# Patient Record
Sex: Female | Born: 1980 | Race: White | Hispanic: No | Smoking: Current every day smoker
Health system: Southern US, Community
[De-identification: ages and names within clinical notes are randomized; demographics above are authoritative.]

## PROBLEM LIST (undated history)

## (undated) DIAGNOSIS — J302 Other seasonal allergic rhinitis: Secondary | ICD-10-CM

## (undated) HISTORY — PX: OTHER SURGICAL HISTORY: SHX169

## (undated) HISTORY — PX: LAPAROSCOPIC UNILATERAL SALPINGO OOPHERECTOMY: SHX5935

## (undated) HISTORY — PX: WISDOM TOOTH EXTRACTION: SHX21

---

## 2001-11-02 ENCOUNTER — Emergency Department (HOSPITAL_COMMUNITY): Admission: EM | Admit: 2001-11-02 | Discharge: 2001-11-03 | Payer: Self-pay | Admitting: Emergency Medicine

## 2002-11-08 HISTORY — PX: LAPAROTOMY: SHX154

## 2003-04-16 ENCOUNTER — Observation Stay (HOSPITAL_COMMUNITY): Admission: RE | Admit: 2003-04-16 | Discharge: 2003-04-17 | Payer: Self-pay | Admitting: Obstetrics and Gynecology

## 2003-11-09 HISTORY — PX: DIAGNOSTIC LAPAROSCOPY: SUR761

## 2004-07-09 ENCOUNTER — Observation Stay (HOSPITAL_COMMUNITY): Admission: RE | Admit: 2004-07-09 | Discharge: 2004-07-10 | Payer: Self-pay | Admitting: Obstetrics and Gynecology

## 2004-11-03 ENCOUNTER — Ambulatory Visit (HOSPITAL_COMMUNITY): Admission: RE | Admit: 2004-11-03 | Discharge: 2004-11-03 | Payer: Self-pay | Admitting: Obstetrics and Gynecology

## 2005-03-03 ENCOUNTER — Other Ambulatory Visit: Admission: RE | Admit: 2005-03-03 | Discharge: 2005-03-03 | Payer: Self-pay | Admitting: Obstetrics and Gynecology

## 2005-09-09 ENCOUNTER — Inpatient Hospital Stay (HOSPITAL_COMMUNITY): Admission: AD | Admit: 2005-09-09 | Discharge: 2005-09-11 | Payer: Self-pay | Admitting: Obstetrics and Gynecology

## 2006-04-29 IMAGING — RF DG HYSTEROGRAM
6 series · 6 of 6 positions shown · IV contrast (omnipaque)
Comparison: none

CLINICAL DATA: 23-year-old with history of left salpingectomy and oophorectomy.  Endometriosis.  Patient had ectopic pregnancy and what is described as a salpingostomy in Friday July, 2004.
 HYSTEROGRAM:  
 Following discussion of the procedure, sterile technique was used to cannulate the cervix.  20 cc of Omnipaque 300 was utilized for the exam.  Reflux of contrast into the uterus during fluoroscopic guidance was performed.
 The uterine contour is normal.  There is filling of proximal most portion of the left fallopian tube, consistent with the history of prior left salpingectomy.  Contrast does fill the right tube later in the exam.  There is a collection of contrast probably within the wall of the tube which appears somewhat amorphous and may be related to prior salpingostomy.  However, the distal portion of the right tube does fill and there is free peritoneal spill on the right.

[Series 1: run · 1 of 1 slices shown (1 of 6)]
[im 1/1]
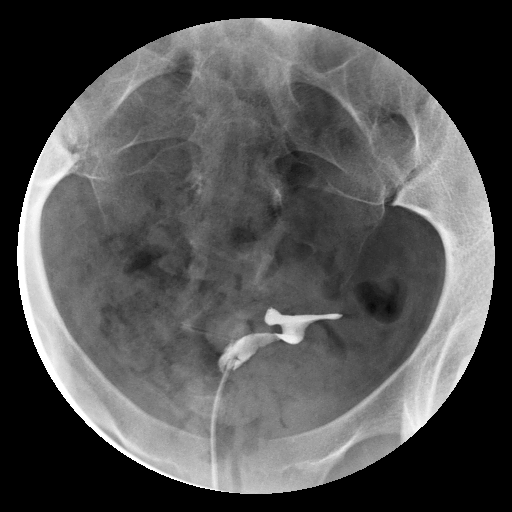

[Series 2: run · 1 of 1 slices shown (2 of 6)]
[im 1/1]
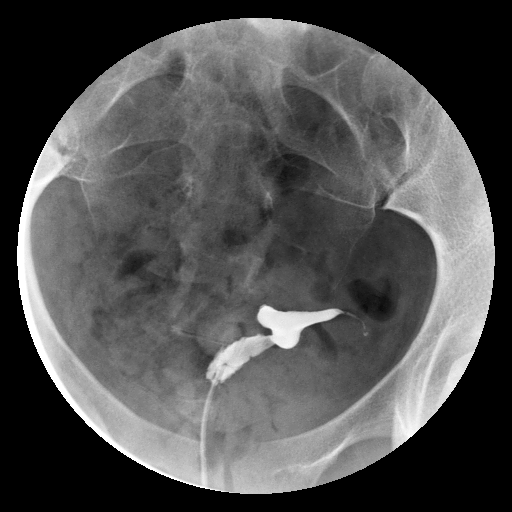

[Series 3: run · 1 of 1 slices shown (3 of 6)]
[im 1/1]
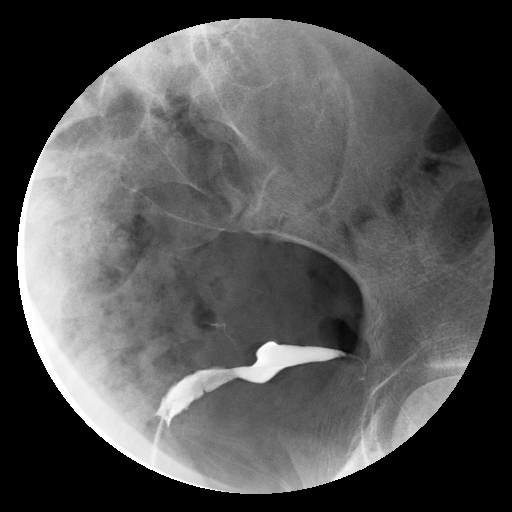

[Series 4: run · 1 of 1 slices shown (4 of 6)]
[im 1/1]
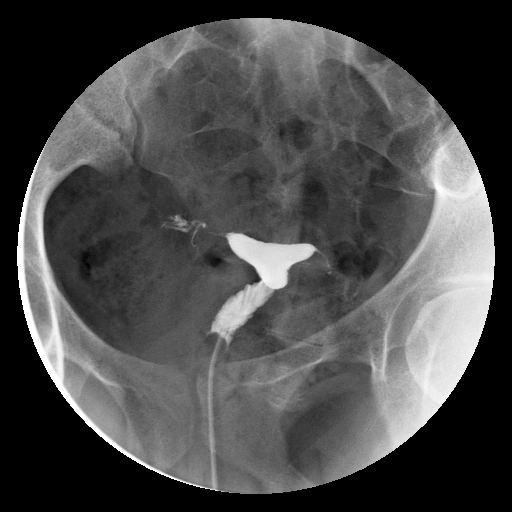

[Series 5: run · 1 of 1 slices shown (5 of 6)]
[im 1/1]
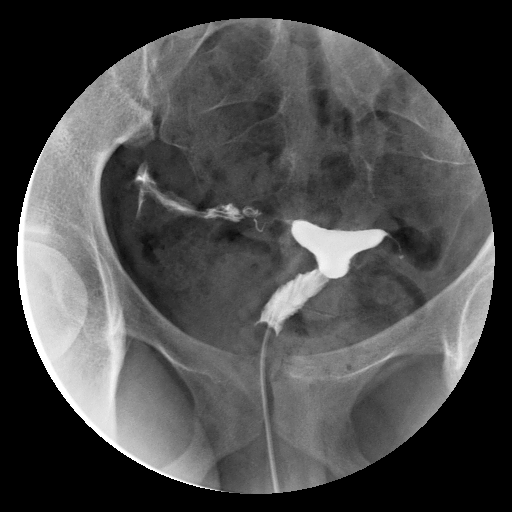

[Series 6: run · 1 of 1 slices shown (6 of 6)]
[im 1/1]
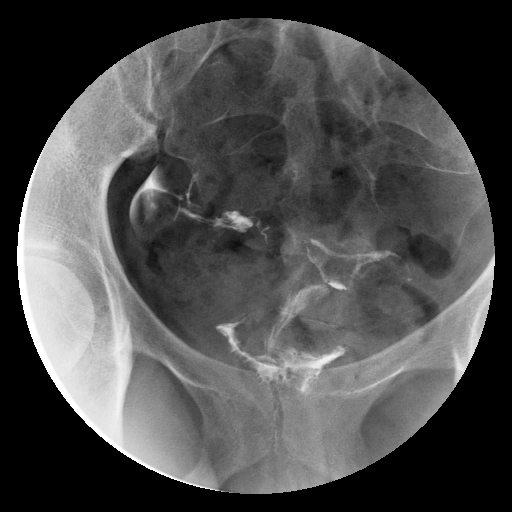

[6 of 6 positions shown; findings below may reference images not displayed]

IMPRESSION: 1.  Normal appearing uterus.  
 2.  Status post left salpingectomy.
 3.  Irregular contour of the lumen of the right fallopian tube, likely representing scar tissue from prior salpingostomy and ectopic pregnancy.  However, there is free spill on the right.

## 2013-11-08 HISTORY — PX: BREAST SURGERY: SHX581

## 2014-10-22 ENCOUNTER — Other Ambulatory Visit (HOSPITAL_COMMUNITY)
Admission: RE | Admit: 2014-10-22 | Discharge: 2014-10-22 | Disposition: A | Discharge status: Home / Self Care | Payer: Self-pay | Source: Ambulatory Visit | Attending: Family Medicine | Admitting: Family Medicine

## 2014-10-22 ENCOUNTER — Other Ambulatory Visit: Payer: Self-pay | Admitting: Family Medicine

## 2014-10-22 DIAGNOSIS — Z124 Encounter for screening for malignant neoplasm of cervix: Secondary | ICD-10-CM | POA: Insufficient documentation

## 2014-10-22 DIAGNOSIS — Z113 Encounter for screening for infections with a predominantly sexual mode of transmission: Secondary | ICD-10-CM | POA: Insufficient documentation

## 2014-10-25 LAB — CYTOLOGY - PAP

## 2016-04-28 ENCOUNTER — Encounter: Payer: Self-pay | Admitting: Hematology and Oncology

## 2016-05-13 ENCOUNTER — Telehealth: Payer: Self-pay | Admitting: Hematology and Oncology

## 2016-05-13 NOTE — Telephone Encounter (Signed)
Telephone call to reschedule appointment. Patient states that she wants to consult with her PCP before seeing a hematologist.

## 2016-05-14 ENCOUNTER — Ambulatory Visit: Payer: 59 | Admitting: Hematology and Oncology

## 2016-12-15 ENCOUNTER — Other Ambulatory Visit (HOSPITAL_COMMUNITY)
Admission: RE | Admit: 2016-12-15 | Discharge: 2016-12-15 | Disposition: A | Discharge status: Home / Self Care | Payer: 59 | Source: Ambulatory Visit | Attending: Obstetrics & Gynecology | Admitting: Obstetrics & Gynecology

## 2016-12-15 ENCOUNTER — Other Ambulatory Visit: Payer: Self-pay | Admitting: Obstetrics & Gynecology

## 2016-12-15 DIAGNOSIS — Z01419 Encounter for gynecological examination (general) (routine) without abnormal findings: Secondary | ICD-10-CM | POA: Diagnosis present

## 2016-12-15 DIAGNOSIS — Z1151 Encounter for screening for human papillomavirus (HPV): Secondary | ICD-10-CM | POA: Diagnosis present

## 2016-12-17 LAB — CYTOLOGY - PAP
DIAGNOSIS: NEGATIVE
HPV: NOT DETECTED

## 2017-12-26 NOTE — Patient Instructions (Addendum)
Your procedure is scheduled on: Wednesday, Feb 27  [facility] am  Pick up the phone at the desk and dial [phone].  Call this number if you have problems the morning of surgery: [phone].  Remember: Do NOT eat or Do NOT drink clear liquids (including water) after Midnight Tuesday.  Take these medicines the morning of surgery with a SIP OF WATER: None  Do Not smoke on the day of surgery.  Stop herbal medications and supplements at this time.  Do NOT wear jewelry (body piercing), metal hair clips/bobby pins, make-up, or nail polish. Do NOT wear lotions, powders, or perfumes.  You may wear deoderant. Do NOT shave for 48 hours prior to surgery. Do NOT bring valuables to the hospital. Contacts may not be worn into surgery.  Leave suitcase in car.  After surgery it may be brought to your room.  For patients admitted to the hospital, checkout time is 11:00 AM the day of discharge. Have a responsible adult drive you home and stay with you for 24 hours after your procedure.  Home with fiance Phillip cell [phone].

## 2017-12-28 NOTE — H&P (Signed)
36yo [id number] who presents for TLH, BSO due to endometriosis and worsening pelvic pain. She notes worsening pelvic pain and discomfort over the past several months.  Pain is intermittent- some days are better than others. When she does have pain, it is very intense and there have been a few occasions where she almost went to the hosptial. As mentioned previously, she has a long standing history of pain due to endometriosis. She states this is typical for her, where a medication will work for a while and then stop. She currently has a Mirena, which was working for a while (placed Feb 2016). Prior to the Mirena, she was on the pills for over 9 yrs with no issues. Denies irregular bleeding. Not having menses. Denies abnormal discharge, itching or irritation. She does take OTC pain medication with minimal improvement. Other than the pelvic pain, she reports no acute complaints. Denies irregular vaginal bleeding. Denies vaginal discharge, itching or irritation.   Current Medications  Taking   Mirena 20 MCG/24HR Intrauterine Device Intrauterine , Notes: GYN   Medication List reviewed and reconciled with the patient    Past Medical History  Endometriosis.           Surgical History  Removal of left tube & ovary 2004  Eptopic pregnancy 2005  negative colposcopy for an abnormal pap, subsequent paps negative.   bilateral breast augmentation with silicone implants 07/2014   Family History  Father: alive 83 yrs, Colon issues,  Mother: alive 62 yrs, HTN, depression, back problems, diagnosed with Hypertension  Paternal Grand Mother: colon cancer in her 23s  Brother 1: alive 34 yrs, Over weight, sleep apnea, high blood pressure, hernia, Diabetes  Daughter(s): alive 5 yrs, A + W  1 brother(s) - healthy. 1daughter(s) - healthy.   No breast cancer. \nNo MIs in the family.\nGrandparents died of alcholism.   Social History  General:  Tobacco use  cigarettes: Current smoker Frequency: intermittent smoker  occsional, social Tobacco history last updated 12/23/2017 Alcohol: social.  Caffeine: yes.  no Recreational drug use.  no DIET.  Exercise: walks.  Marital Status: Significant other.  Children: daughter (s).  OCCUPATION: Office Work.    Gyn History  Sexual activity currently sexually active with one female partner for 3 years.  Periods : Irregular .  LMP Nov/2018.  Birth control Mirena IUD.  Last pap smear date 12/2016 Negative, HPV-.  Last mammogram date N/A.    OB History  Number of pregnancies 2.  Pregnancy # 1 ectopic.  Pregnancy # 2 live birth, vaginal delivery, girl.  Ectopic pregnancy yes.    Allergies  Penicillin: hives - Allergy   Hospitalization/Major Diagnostic Procedure  No Hospitalization History.   Review of Systems  CONSTITUTIONAL:  no Appetite changes. no Chills. no Fatigue. no Fever.  CARDIOLOGY:  no Chest pain.  RESPIRATORY:  no Shortness of breath. no Cough.  UROLOGY:  no Dysuria. no Urinary frequency. no Urinary incontinence. no Urinary urgency.  GASTROENTEROLOGY:  Abdominal pain yes. Bloating/belching yes. no Change in bowel habits. no Change in bowel movements.  FEMALE REPRODUCTIVE:  no Abnormal vaginal discharge. no Breast lumps or discharge. no Breast pain. no Hot flashes. no Sexual problems. no Vaginal irritation. no Vaginal itching.  NEUROLOGY:  no Dizziness. no Headache.  PSYCHOLOGY:  no Anxiety. no Depression.  SKIN:  no Rash. no Hives.  HEMATOLOGY/LYMPH:  no Anemia. Using Blood Thinners no.     Examination in office: Vital Signs  Wt 137.7, Wt change 9.7 lb, Ht 65, BMI  22.91, Pulse sitting 100, BP sitting 108/66.   Examination  General Examination: GENERAL APPEARANCE well developed, well nourished.  SKIN: warm and dry, no rashes.  NECK: supple, normal appearance.  LUNGS: clear to auscultation bilaterally, no wheezes, rhonchi, rales.  HEART: no murmurs, regular rate and rhythm.  ABDOMEN: soft, diffuse lower abdominal tenderness.   FEMALE GENITOURINARY: normal external genitalia, labia - unremarkable, vagina - pink moist mucosa, no lesions or abnormal discharge, cervix - no discharge or lesions or CMT, adnexa - no masses or tenderness, uterus - normal size on palpation, mild tenderness to palpation, slightly mobile.  MUSCULOSKELETAL no calf tenderness bilaterally.  EXTREMITIES: no edema present.  PSYCH: appropriate mood and affect.     A/P: 36yo who presents for total laparoscopic hysterectomy and bilateral salpingo-oophorectomy, possible conversion to open procedure -NPO -LR @ 125cc/hr -SCDs to OR -Gent/Clinda to OR -CBC, T&S to be completed -Risk/benefit reviewed with patient including but not limited to risk of bleeding, infection, injury to surrounding organs including bowel, bladder, ureter.  Discussed possible conversion to open due to prior surgery and concern for adhesions.  Pt aware and desires to proceed.  Myna HidalgoJennifer Karrie Fluellen, DO [phone] (cell) [phone] (office)

## 2018-01-02 ENCOUNTER — Encounter (HOSPITAL_COMMUNITY): Payer: Self-pay | Admitting: *Deleted

## 2018-01-02 ENCOUNTER — Encounter (HOSPITAL_COMMUNITY)
Admission: RE | Admit: 2018-01-02 | Discharge: 2018-01-02 | Disposition: A | Discharge status: Home / Self Care | Payer: 59 | Source: Ambulatory Visit | Attending: Obstetrics & Gynecology | Admitting: Obstetrics & Gynecology

## 2018-01-02 ENCOUNTER — Other Ambulatory Visit: Payer: Self-pay

## 2018-01-02 DIAGNOSIS — Z23 Encounter for immunization: Secondary | ICD-10-CM | POA: Diagnosis not present

## 2018-01-02 DIAGNOSIS — N838 Other noninflammatory disorders of ovary, fallopian tube and broad ligament: Secondary | ICD-10-CM | POA: Diagnosis not present

## 2018-01-02 DIAGNOSIS — F17210 Nicotine dependence, cigarettes, uncomplicated: Secondary | ICD-10-CM | POA: Diagnosis not present

## 2018-01-02 DIAGNOSIS — R102 Pelvic and perineal pain: Secondary | ICD-10-CM | POA: Diagnosis not present

## 2018-01-02 DIAGNOSIS — N946 Dysmenorrhea, unspecified: Secondary | ICD-10-CM | POA: Diagnosis not present

## 2018-01-02 DIAGNOSIS — N809 Endometriosis, unspecified: Secondary | ICD-10-CM | POA: Diagnosis present

## 2018-01-02 DIAGNOSIS — N800 Endometriosis of uterus: Secondary | ICD-10-CM | POA: Diagnosis not present

## 2018-01-02 DIAGNOSIS — N8301 Follicular cyst of right ovary: Secondary | ICD-10-CM | POA: Diagnosis not present

## 2018-01-02 HISTORY — DX: Other seasonal allergic rhinitis: J30.2

## 2018-01-02 LAB — COMPREHENSIVE METABOLIC PANEL
ALBUMIN: 3.9 g/dL (ref 3.5–5.0)
ALT: 18 U/L (ref 14–54)
ANION GAP: 7 (ref 5–15)
AST: 19 U/L (ref 15–41)
Alkaline Phosphatase: 55 U/L (ref 38–126)
BUN: 24 mg/dL — ABNORMAL HIGH (ref 6–20)
CHLORIDE: 108 mmol/L (ref 101–111)
CO2: 21 mmol/L — ABNORMAL LOW (ref 22–32)
Calcium: 8.7 mg/dL — ABNORMAL LOW (ref 8.9–10.3)
Creatinine, Ser: 0.66 mg/dL (ref 0.44–1.00)
GFR calc Af Amer: >60 mL/min (ref >60)
Glucose, Bld: 88 mg/dL (ref 65–99)
POTASSIUM: 3.7 mmol/L (ref 3.5–5.1)
Sodium: 136 mmol/L (ref 135–145)
Total Bilirubin: 0.2 mg/dL — ABNORMAL LOW (ref 0.3–1.2)
Total Protein: 7.1 g/dL (ref 6.5–8.1)

## 2018-01-02 LAB — TYPE AND SCREEN
ABO/RH(D): O POS
Antibody Screen: NEGATIVE

## 2018-01-02 LAB — CBC
HCT: 39 % (ref 36.0–46.0)
Hemoglobin: 13.6 g/dL (ref 12.0–15.0)
MCH: 35.1 pg — ABNORMAL HIGH (ref 26.0–34.0)
MCHC: 34.9 g/dL (ref 30.0–36.0)
MCV: 100.8 fL — ABNORMAL HIGH (ref 78.0–100.0)
Platelets: 242 10*3/uL (ref 150–400)
RBC: 3.87 MIL/uL (ref 3.87–5.11)
RDW: 13 % (ref 11.5–15.5)
WBC: 6.9 10*3/uL (ref 4.0–10.5)

## 2018-01-02 LAB — ABO/RH: ABO/RH(D): O POS

## 2018-01-03 NOTE — Anesthesia Preprocedure Evaluation (Addendum)
Anesthesia Evaluation  Patient identified by MRN, date of birth, ID band Patient awake    Reviewed: Allergy & Precautions, Patient's Chart, lab work & pertinent test results  Airway Mallampati: II  TM Distance: >3 FB Neck ROM: Full    Dental no notable dental hx.    Pulmonary Current Smoker,    Pulmonary exam normal breath sounds clear to auscultation       Cardiovascular negative cardio ROS Normal cardiovascular exam Rhythm:Regular Rate:Normal     Neuro/Psych    GI/Hepatic   Endo/Other    Renal/GU      Musculoskeletal   Abdominal   Peds  Hematology   Anesthesia Other Findings   Reproductive/Obstetrics negative OB ROS                            Lab Results  Component Value Date   WBC 6.9 01/02/2018   HGB 13.6 01/02/2018   HCT 39.0 01/02/2018   MCV 100.8 (H) 01/02/2018   PLT 242 01/02/2018   Lab Results  Component Value Date   CREATININE 0.66 01/02/2018   BUN 24 (H) 01/02/2018   NA 136 01/02/2018   K 3.7 01/02/2018   CL 108 01/02/2018   CO2 21 (L) 01/02/2018    Anesthesia Physical Anesthesia Plan  ASA: II  Anesthesia Plan: General   Post-op Pain Management:    Induction: Intravenous  PONV Risk Score and Plan: 2 and Treatment may vary due to age or medical condition, Dexamethasone and Ondansetron  Airway Management Planned: Oral ETT  Additional Equipment:   Intra-op Plan:   Post-operative Plan: Extubation in OR  Informed Consent: I have reviewed the patients History and Physical, chart, labs and discussed the procedure including the risks, benefits and alternatives for the proposed anesthesia with the patient or authorized representative who has indicated his/her understanding and acceptance.   Dental advisory given  Plan Discussed with: CRNA and Anesthesiologist  Anesthesia Plan Comments:         Anesthesia Quick Evaluation

## 2018-01-04 ENCOUNTER — Encounter (HOSPITAL_COMMUNITY)
Admission: AD | Disposition: A | Discharge status: Home / Self Care | Payer: Self-pay | Source: Ambulatory Visit | Attending: Obstetrics & Gynecology

## 2018-01-04 ENCOUNTER — Observation Stay (HOSPITAL_COMMUNITY)
Admission: AD | Admit: 2018-01-04 | Discharge: 2018-01-05 | Disposition: A | Discharge status: Home / Self Care | Payer: 59 | Source: Ambulatory Visit | Attending: Obstetrics & Gynecology | Admitting: Obstetrics & Gynecology

## 2018-01-04 ENCOUNTER — Other Ambulatory Visit: Payer: Self-pay

## 2018-01-04 ENCOUNTER — Ambulatory Visit (HOSPITAL_COMMUNITY): Payer: 59 | Admitting: Anesthesiology

## 2018-01-04 ENCOUNTER — Encounter (HOSPITAL_COMMUNITY): Payer: Self-pay | Admitting: Certified Registered Nurse Anesthetist

## 2018-01-04 DIAGNOSIS — N838 Other noninflammatory disorders of ovary, fallopian tube and broad ligament: Secondary | ICD-10-CM | POA: Insufficient documentation

## 2018-01-04 DIAGNOSIS — Z23 Encounter for immunization: Secondary | ICD-10-CM | POA: Insufficient documentation

## 2018-01-04 DIAGNOSIS — N946 Dysmenorrhea, unspecified: Principal | ICD-10-CM | POA: Insufficient documentation

## 2018-01-04 DIAGNOSIS — F17210 Nicotine dependence, cigarettes, uncomplicated: Secondary | ICD-10-CM | POA: Insufficient documentation

## 2018-01-04 DIAGNOSIS — N809 Endometriosis, unspecified: Secondary | ICD-10-CM | POA: Diagnosis present

## 2018-01-04 DIAGNOSIS — R102 Pelvic and perineal pain: Secondary | ICD-10-CM | POA: Insufficient documentation

## 2018-01-04 DIAGNOSIS — N8301 Follicular cyst of right ovary: Secondary | ICD-10-CM | POA: Insufficient documentation

## 2018-01-04 DIAGNOSIS — N800 Endometriosis of uterus: Secondary | ICD-10-CM | POA: Insufficient documentation

## 2018-01-04 HISTORY — PX: TOTAL LAPAROSCOPIC HYSTERECTOMY WITH SALPINGECTOMY: SHX6742

## 2018-01-04 SURGERY — HYSTERECTOMY, TOTAL, LAPAROSCOPIC, WITH SALPINGECTOMY
Anesthesia: General | Site: Abdomen | Laterality: Bilateral

## 2018-01-04 MED ORDER — HYDROCODONE-ACETAMINOPHEN 5-325 MG PO TABS
1.0000 | ORAL_TABLET | ORAL | Status: DC | PRN
  Administered 2018-01-04: 2 via ORAL
  Administered 2018-01-04 – 2018-01-05 (×3): 1 via ORAL
  Filled 2018-01-04 (×2): qty 1
  Filled 2018-01-04: qty 2
  Filled 2018-01-04: qty 1

## 2018-01-04 MED ORDER — LIDOCAINE HCL (PF) 1 % IJ SOLN
INTRAMUSCULAR | Status: AC
  Filled 2018-01-04: qty 5

## 2018-01-04 MED ORDER — ACETAMINOPHEN 10 MG/ML IV SOLN: 1000.0000 mg | Freq: Once | INTRAVENOUS | Status: DC | PRN

## 2018-01-04 MED ORDER — EPHEDRINE SULFATE 50 MG/ML IJ SOLN
INTRAMUSCULAR | Status: DC | PRN
  Administered 2018-01-04 (×2): 10 mg via INTRAVENOUS

## 2018-01-04 MED ORDER — KETOROLAC TROMETHAMINE 30 MG/ML IJ SOLN
INTRAMUSCULAR | Status: AC
  Filled 2018-01-04: qty 1

## 2018-01-04 MED ORDER — ONDANSETRON HCL 4 MG/2ML IJ SOLN
INTRAMUSCULAR | Status: AC
Start: 2018-01-04 — End: ?
  Filled 2018-01-04: qty 2

## 2018-01-04 MED ORDER — MIDAZOLAM HCL 2 MG/2ML IJ SOLN
INTRAMUSCULAR | Status: AC
  Filled 2018-01-04: qty 2

## 2018-01-04 MED ORDER — HYDROMORPHONE HCL 1 MG/ML IJ SOLN
0.2500 mg | INTRAMUSCULAR | Status: DC | PRN
  Administered 2018-01-04 (×4): 0.5 mg via INTRAVENOUS

## 2018-01-04 MED ORDER — GABAPENTIN 300 MG PO CAPS
300.0000 mg | ORAL_CAPSULE | ORAL | Status: AC
  Administered 2018-01-04: 300 mg via ORAL

## 2018-01-04 MED ORDER — BISACODYL 5 MG PO TBEC
5.0000 mg | DELAYED_RELEASE_TABLET | Freq: Every day | ORAL | Status: DC | PRN
Start: 2018-01-04 — End: 2018-01-05
  Filled 2018-01-04: qty 1

## 2018-01-04 MED ORDER — MIDAZOLAM HCL 2 MG/2ML IJ SOLN
INTRAMUSCULAR | Status: DC | PRN
  Administered 2018-01-04: 2 mg via INTRAVENOUS

## 2018-01-04 MED ORDER — MEPERIDINE HCL 25 MG/ML IJ SOLN
6.2500 mg | INTRAMUSCULAR | Status: DC | PRN
Start: 2018-01-04 — End: 2018-01-04
  Administered 2018-01-04 (×2): 12.5 mg via INTRAVENOUS

## 2018-01-04 MED ORDER — DEXTROSE 5 % IV SOLN
INTRAVENOUS | Status: AC
  Administered 2018-01-04: 115 mL via INTRAVENOUS
  Filled 2018-01-04: qty 7.5

## 2018-01-04 MED ORDER — ONDANSETRON HCL 4 MG/2ML IJ SOLN
INTRAMUSCULAR | Status: DC | PRN
  Administered 2018-01-04: 4 mg via INTRAVENOUS

## 2018-01-04 MED ORDER — DEXAMETHASONE SODIUM PHOSPHATE 10 MG/ML IJ SOLN
INTRAMUSCULAR | Status: DC | PRN
  Administered 2018-01-04: 4 mg via INTRAVENOUS

## 2018-01-04 MED ORDER — MENTHOL 3 MG MT LOZG: 1.0000 | LOZENGE | OROMUCOSAL | Status: DC | PRN

## 2018-01-04 MED ORDER — KETOROLAC TROMETHAMINE 15 MG/ML IJ SOLN
15.0000 mg | INTRAMUSCULAR | Status: DC
  Filled 2018-01-04 (×2): qty 1

## 2018-01-04 MED ORDER — SCOPOLAMINE 1 MG/3DAYS TD PT72
MEDICATED_PATCH | TRANSDERMAL | Status: AC
  Administered 2018-01-04: 1.5 mg via TRANSDERMAL
  Filled 2018-01-04: qty 1

## 2018-01-04 MED ORDER — BUPIVACAINE HCL (PF) 0.25 % IJ SOLN
INTRAMUSCULAR | Status: DC | PRN
  Administered 2018-01-04: 30 mL

## 2018-01-04 MED ORDER — ACETAMINOPHEN 500 MG PO TABS
1000.0000 mg | ORAL_TABLET | ORAL | Status: AC
  Administered 2018-01-04: 1000 mg via ORAL

## 2018-01-04 MED ORDER — BUPIVACAINE-EPINEPHRINE (PF) 0.25% -1:200000 IJ SOLN
INTRAMUSCULAR | Status: AC
  Filled 2018-01-04: qty 30

## 2018-01-04 MED ORDER — PROPOFOL 10 MG/ML IV BOLUS
INTRAVENOUS | Status: DC | PRN
  Administered 2018-01-04: 120 mg via INTRAVENOUS

## 2018-01-04 MED ORDER — KETOROLAC TROMETHAMINE 30 MG/ML IJ SOLN: 30.0000 mg | Freq: Four times a day (QID) | INTRAMUSCULAR | Status: DC

## 2018-01-04 MED ORDER — ACETAMINOPHEN 500 MG PO TABS
ORAL_TABLET | ORAL | Status: AC
  Administered 2018-01-04: 1000 mg via ORAL
  Filled 2018-01-04: qty 2

## 2018-01-04 MED ORDER — ONDANSETRON HCL 4 MG PO TABS: 4.0000 mg | ORAL_TABLET | Freq: Four times a day (QID) | ORAL | Status: DC | PRN

## 2018-01-04 MED ORDER — ONDANSETRON HCL 4 MG/2ML IJ SOLN: 4.0000 mg | Freq: Four times a day (QID) | INTRAMUSCULAR | Status: DC | PRN

## 2018-01-04 MED ORDER — LACTATED RINGERS IV SOLN
INTRAVENOUS | Status: DC
  Administered 2018-01-04 (×3): via INTRAVENOUS

## 2018-01-04 MED ORDER — HYDROCODONE-ACETAMINOPHEN 7.5-325 MG PO TABS: 1.0000 | ORAL_TABLET | Freq: Once | ORAL | Status: DC | PRN

## 2018-01-04 MED ORDER — DEXAMETHASONE SODIUM PHOSPHATE 4 MG/ML IJ SOLN
INTRAMUSCULAR | Status: AC
  Filled 2018-01-04: qty 1

## 2018-01-04 MED ORDER — GABAPENTIN 300 MG PO CAPS
ORAL_CAPSULE | ORAL | Status: AC
Start: 2018-01-04 — End: 2018-01-04
  Administered 2018-01-04: 300 mg via ORAL
  Filled 2018-01-04: qty 1

## 2018-01-04 MED ORDER — DOCUSATE SODIUM 100 MG PO CAPS
100.0000 mg | ORAL_CAPSULE | Freq: Two times a day (BID) | ORAL | Status: DC
  Administered 2018-01-04 – 2018-01-05 (×3): 100 mg via ORAL
  Filled 2018-01-04 (×3): qty 1

## 2018-01-04 MED ORDER — SIMETHICONE 80 MG PO CHEW: 80.0000 mg | CHEWABLE_TABLET | Freq: Four times a day (QID) | ORAL | Status: DC | PRN

## 2018-01-04 MED ORDER — SUGAMMADEX SODIUM 200 MG/2ML IV SOLN
INTRAVENOUS | Status: DC | PRN
  Administered 2018-01-04: 120 mg via INTRAVENOUS

## 2018-01-04 MED ORDER — SUGAMMADEX SODIUM 200 MG/2ML IV SOLN
INTRAVENOUS | Status: AC
  Filled 2018-01-04: qty 2

## 2018-01-04 MED ORDER — HYDROMORPHONE HCL 1 MG/ML IJ SOLN
INTRAMUSCULAR | Status: AC
  Filled 2018-01-04: qty 1

## 2018-01-04 MED ORDER — MEPERIDINE HCL 25 MG/ML IJ SOLN
INTRAMUSCULAR | Status: AC
  Administered 2018-01-04: 12.5 mg via INTRAVENOUS
  Filled 2018-01-04: qty 1

## 2018-01-04 MED ORDER — PROMETHAZINE HCL 25 MG/ML IJ SOLN: 6.2500 mg | INTRAMUSCULAR | Status: DC | PRN

## 2018-01-04 MED ORDER — KETOROLAC TROMETHAMINE 30 MG/ML IJ SOLN
30.0000 mg | Freq: Four times a day (QID) | INTRAMUSCULAR | Status: DC
  Administered 2018-01-04 – 2018-01-05 (×3): 30 mg via INTRAVENOUS
  Filled 2018-01-04 (×3): qty 1

## 2018-01-04 MED ORDER — INFLUENZA VAC SPLIT QUAD 0.5 ML IM SUSY
0.5000 mL | PREFILLED_SYRINGE | INTRAMUSCULAR | Status: AC
  Administered 2018-01-05: 0.5 mL via INTRAMUSCULAR
  Filled 2018-01-04: qty 0.5

## 2018-01-04 MED ORDER — LIDOCAINE HCL (CARDIAC) 20 MG/ML IV SOLN
INTRAVENOUS | Status: DC | PRN
  Administered 2018-01-04: 50 mg via INTRAVENOUS

## 2018-01-04 MED ORDER — MAGNESIUM CITRATE PO SOLN
1.0000 | Freq: Once | ORAL | Status: DC | PRN
  Filled 2018-01-04: qty 296

## 2018-01-04 MED ORDER — EPHEDRINE 5 MG/ML INJ
INTRAVENOUS | Status: AC
  Filled 2018-01-04: qty 10

## 2018-01-04 MED ORDER — PROPOFOL 10 MG/ML IV BOLUS
INTRAVENOUS | Status: AC
  Filled 2018-01-04: qty 20

## 2018-01-04 MED ORDER — ROCURONIUM BROMIDE 100 MG/10ML IV SOLN
INTRAVENOUS | Status: DC | PRN
  Administered 2018-01-04: 35 mg via INTRAVENOUS
  Administered 2018-01-04 (×2): 10 mg via INTRAVENOUS

## 2018-01-04 MED ORDER — BUPIVACAINE HCL (PF) 0.25 % IJ SOLN
INTRAMUSCULAR | Status: AC
  Filled 2018-01-04: qty 30

## 2018-01-04 MED ORDER — SODIUM CHLORIDE 0.9 % IR SOLN
Status: DC | PRN
  Administered 2018-01-04: 3000 mL

## 2018-01-04 MED ORDER — HYDROMORPHONE HCL 1 MG/ML IJ SOLN
INTRAMUSCULAR | Status: AC
  Administered 2018-01-04: 0.5 mg via INTRAVENOUS
  Filled 2018-01-04: qty 1

## 2018-01-04 MED ORDER — KETOROLAC TROMETHAMINE 30 MG/ML IJ SOLN
INTRAMUSCULAR | Status: AC
  Administered 2018-01-04: 15 mg via INTRAVENOUS
  Filled 2018-01-04: qty 1

## 2018-01-04 MED ORDER — PANTOPRAZOLE SODIUM 40 MG PO TBEC
40.0000 mg | DELAYED_RELEASE_TABLET | Freq: Every day | ORAL | Status: DC
  Administered 2018-01-04 – 2018-01-05 (×2): 40 mg via ORAL
  Filled 2018-01-04 (×2): qty 1

## 2018-01-04 MED ORDER — LACTATED RINGERS IV SOLN
INTRAVENOUS | Status: DC
  Administered 2018-01-04: 15:00:00 via INTRAVENOUS

## 2018-01-04 MED ORDER — FENTANYL CITRATE (PF) 250 MCG/5ML IJ SOLN
INTRAMUSCULAR | Status: AC
  Filled 2018-01-04: qty 5

## 2018-01-04 MED ORDER — FENTANYL CITRATE (PF) 100 MCG/2ML IJ SOLN
INTRAMUSCULAR | Status: DC | PRN
  Administered 2018-01-04 (×5): 50 ug via INTRAVENOUS

## 2018-01-04 MED ORDER — SCOPOLAMINE 1 MG/3DAYS TD PT72
1.0000 | MEDICATED_PATCH | TRANSDERMAL | Status: DC
  Administered 2018-01-04: 1.5 mg via TRANSDERMAL

## 2018-01-04 SURGICAL SUPPLY — 41 items
ADH SKN CLS APL DERMABOND .7 (GAUZE/BANDAGES/DRESSINGS) ×1
APL SRG 38 LTWT LNG FL B (MISCELLANEOUS) ×1
APPLICATOR ARISTA FLEXITIP XL (MISCELLANEOUS) ×2 IMPLANT
CABLE HIGH FREQUENCY MONO STRZ (ELECTRODE) ×2 IMPLANT
COVER MAYO STAND STRL (DRAPES) ×3 IMPLANT
DERMABOND ADVANCED (GAUZE/BANDAGES/DRESSINGS) ×2
DERMABOND ADVANCED .7 DNX12 (GAUZE/BANDAGES/DRESSINGS) ×1 IMPLANT
DISSECTOR BLUNT TIP ENDO 5MM (MISCELLANEOUS) ×2 IMPLANT
DURAPREP 26ML APPLICATOR (WOUND CARE) ×6 IMPLANT
GLOVE BIOGEL PI IND STRL 6.5 (GLOVE) ×2 IMPLANT
GLOVE BIOGEL PI IND STRL 7.0 (GLOVE) ×2 IMPLANT
GLOVE BIOGEL PI INDICATOR 6.5 (GLOVE) ×4
GLOVE BIOGEL PI INDICATOR 7.0 (GLOVE) ×4
GLOVE ECLIPSE 6.5 STRL STRAW (GLOVE) ×6 IMPLANT
HEMOSTAT ARISTA ABSORB 3G PWDR (MISCELLANEOUS) ×2 IMPLANT
NEEDLE INSUFFLATION 120MM (ENDOMECHANICALS) ×3 IMPLANT
OCCLUDER COLPOPNEUMO (BALLOONS) ×3 IMPLANT
PACK LAVH (CUSTOM PROCEDURE TRAY) ×3 IMPLANT
PACK ROBOTIC GOWN (GOWN DISPOSABLE) ×3 IMPLANT
PACK TRENDGUARD 450 HYBRID PRO (MISCELLANEOUS) ×1 IMPLANT
PAD OB MATERNITY 4.3X12.25 (PERSONAL CARE ITEMS) ×3 IMPLANT
POUCH LAPAROSCOPIC INSTRUMENT (MISCELLANEOUS) ×2 IMPLANT
PROTECTOR NERVE ULNAR (MISCELLANEOUS) ×6 IMPLANT
SET IRRIG TUBING LAPAROSCOPIC (IRRIGATION / IRRIGATOR) ×3 IMPLANT
SHEARS HARMONIC ACE PLUS 36CM (ENDOMECHANICALS) ×2 IMPLANT
SLEEVE XCEL OPT CAN 5 100 (ENDOMECHANICALS) ×5 IMPLANT
SOLUTION ELECTROLUBE (MISCELLANEOUS) ×2 IMPLANT
SUT MON AB 4-0 PS1 27 (SUTURE) ×3 IMPLANT
SUT VIC AB 0 CT1 18XCR BRD8 (SUTURE) ×1 IMPLANT
SUT VIC AB 0 CT1 27 (SUTURE) ×6
SUT VIC AB 0 CT1 27XBRD ANBCTR (SUTURE) ×2 IMPLANT
SUT VIC AB 0 CT1 36 (SUTURE) ×6 IMPLANT
SUT VIC AB 0 CT1 8-18 (SUTURE) ×3
SUT VICRYL 0 UR6 27IN ABS (SUTURE) ×2 IMPLANT
SYR BULB IRRIGATION 50ML (SYRINGE) ×2 IMPLANT
TIP UTERINE 6.7X8CM BLUE DISP (MISCELLANEOUS) ×2 IMPLANT
TOWEL OR 17X24 6PK STRL BLUE (TOWEL DISPOSABLE) ×6 IMPLANT
TRAY FOLEY BAG SILVER LF 14FR (SET/KITS/TRAYS/PACK) ×2 IMPLANT
TRENDGUARD 450 HYBRID PRO PACK (MISCELLANEOUS) ×3
TROCAR XCEL NON-BLD 5MMX100MML (ENDOMECHANICALS) ×3 IMPLANT
WARMER LAPAROSCOPE (MISCELLANEOUS) ×3 IMPLANT

## 2018-01-04 NOTE — Anesthesia Procedure Notes (Signed)
Procedure Name: Intubation Date/Time: 01/04/2018 10:46 AM Performed by: Cleda ClarksBrowder, Louanne Calvillo R, CRNA Pre-anesthesia Checklist: Patient identified, Emergency Drugs available, Suction available and Patient being monitored Patient Re-evaluated:Patient Re-evaluated prior to induction Oxygen Delivery Method: Circle system utilized Preoxygenation: Pre-oxygenation with 100% oxygen Induction Type: IV induction Ventilation: Mask ventilation without difficulty Laryngoscope Size: Miller and 2 Grade View: Grade I Tube type: Oral Tube size: 7.0 mm Number of attempts: 1 Airway Equipment and Method: Stylet Placement Confirmation: ETT inserted through vocal cords under direct vision,  positive ETCO2 and breath sounds checked- equal and bilateral Secured at: 20 cm Tube secured with: Tape Dental Injury: Teeth and Oropharynx as per pre-operative assessment

## 2018-01-04 NOTE — Transfer of Care (Signed)
Immediate Anesthesia Transfer of Care Note  Patient: Madeline Ramirez  Procedure(s) Performed: TOTAL LAPAROSCOPIC HYSTERECTOMY WITH SALPINGECTOMY (Bilateral Abdomen)  Patient Location: PACU  Anesthesia Type:General  Level of Consciousness: awake, alert  and oriented  Airway & Oxygen Therapy: Patient Spontanous Breathing and Patient connected to nasal cannula oxygen  Post-op Assessment: Report given to RN and Post -op Vital signs reviewed and stable  Post vital signs: Reviewed and stable  Last Vitals:  Vitals:   01/04/18 1007  BP: 115/73  Pulse: 75  Resp: 16  Temp: 36.5 C  SpO2: 100%    Last Pain:  Vitals:   01/04/18 1007  TempSrc: Oral  PainSc: 3          Complications: No apparent anesthesia complications

## 2018-01-04 NOTE — Op Note (Signed)
Preoperative Diagnosis: Abnormal uterine bleeding Postoperative Diagnosis: same  Procedure: Total laparoscopic hysterectomy and right salpingo-oophorectomy Surgeon: Dr. Myna HidalgoJennifer Shimshon Narula  Assistant: Dr. Gerald Leitzara Cole Anesthetic: General  IVF: 1800cc EBL: 150cc  UOP: 150cc  Specimens: 1) Uterus and right fallopian tube and ovary   Findings: No free fluid or omental studding appreciated. Normal appearing liver, gallbladder and bowel. Left tube and ovary absent.  Enlarged right ovary, normal tube and uterus.  Procedure: The patient was taken to the operating room where general anesthesia was found to be adequate. The patient's abdomen was prepped with ChloraPrep. The perineum and vagina were prepped with multiple layers of Betadine. The patient was sterilely draped. A Foley catheter was placed in the bladder. A RUMI manipulator was placed inside the uterus. Gown and gloves were changed and attention was turned to the abdomen. An incision was made in the supraumbilical area and the Veress needle was inserted into the abdominal cavity without difficulty. Proper placement was confirmed using the saline drop test and opening pressure was 2mmHg. A pneumoperitoneum was obtained. The laparoscopic trocar and the laparoscope were placed under direct visualization. Two additional ports were placed in the right and left lower quadrants. Each area was injected with quarter percent Marcaine. A small incision was made and a 5 mm trocar was inserted into the abdominal cavity under direct visualization. An abdominal scan was performed with the findings noted above.   Attention was turned to the right adnexa. The ureter was identified.  The IP was ligated with the harmonic and serial ligation was performed to remove the right ovary and tube.  The right round ligament was clamped, elevated, ligated, and divided with the Harmonic. The vesicouterine fold of peritoneum was incised anteriorly for creation of the vesicouterine flap.   The left round ligament was clamped and cut with the harmonic.  The incision was carried down using the harmonic to complete the bladder flap.  Skeletonization of the left uterine artery was performed and ligated with the Kleppinger.  A similar procedure was carried out on the right side.  The RUMI manipulator was palpated and the bladder was further dissected below the manipulator.  Using both the harmonic and the laparoscopic hook an anterior colpotomy was performed and continued in a circumferential fashion. Once the uterus and cervix were completely excised- the uterus and ovary with tube was removed vaginally.  The case then proceeded vaginally.  The cuff was closed in a running fashion with interrupted figure of eight stitches used to obtain excellent hemostasis.    Gown and gloves were changed and the case was turned back to the abdomen.  The pneumoperitoneum was reestablished. The pelvis was irrigated and inspected, excellent hemostasis was noted.  Arista was placed in the abdomen.  The lateral 5 mm trocars were removed under direct visualization. The pneumoperitoneum was allowed to escape. The subumbilical trocar was removed. Dermabond was placed over the three trocar sites. The patient tolerated her procedure well. She was awakened from her anesthetic without difficulty and then transported to the recovery room in stable condition. Sponge, needle, and instrument counts were correct. Dr. Richardson Doppole assisted due to the complexity of the case.  Myna HidalgoJennifer Ehan Freas, DO  [phone] (pager)  [phone] (office)

## 2018-01-04 NOTE — Anesthesia Postprocedure Evaluation (Signed)
Anesthesia Post Note  Patient: Penny PiaLisa M Sylvain  Procedure(s) Performed: TOTAL LAPAROSCOPIC HYSTERECTOMY WITH SALPINGECTOMY (Bilateral Abdomen)     Patient location during evaluation: PACU Anesthesia Type: General Level of consciousness: awake and alert Pain management: pain level controlled Vital Signs Assessment: post-procedure vital signs reviewed and stable Respiratory status: spontaneous breathing, nonlabored ventilation, respiratory function stable and patient connected to nasal cannula oxygen Cardiovascular status: blood pressure returned to baseline and stable Postop Assessment: no apparent nausea or vomiting Anesthetic complications: no    Last Vitals:  Vitals:   01/04/18 1450 01/04/18 1555  BP: 108/67 117/61  Pulse: 85 95  Resp: 18 17  Temp: (!) 36.4 C 36.7 C  SpO2: 98% 98%    Last Pain:  Vitals:   01/04/18 1555  TempSrc: Oral  PainSc:    Pain Goal:                 Trevor IhaStephen A Talya Quain

## 2018-01-04 NOTE — Interval H&P Note (Signed)
History and Physical Interval Note:  01/04/2018 10:19 AM  Madeline Ramirez  has presented today for surgery, with the diagnosis of N80.9 Endometriosis N94.6 Dysmenorrhea  The various methods of treatment have been discussed with the patient and family. After consideration of risks, benefits and other options for treatment, the patient has consented to  Procedure(s): TOTAL LAPAROSCOPIC HYSTERECTOMY WITH SALPINGECTOMY (Bilateral) and oophorectomy as a surgical intervention .  The patient's history has been reviewed, patient examined, no change in status, stable for surgery.  I have reviewed the patient's chart and labs.  Questions were answered to the patient's satisfaction.     Sharon SellerJennifer M Raidyn Wassink

## 2018-01-05 ENCOUNTER — Encounter (HOSPITAL_COMMUNITY): Payer: Self-pay | Admitting: Obstetrics & Gynecology

## 2018-01-05 DIAGNOSIS — N946 Dysmenorrhea, unspecified: Secondary | ICD-10-CM | POA: Diagnosis not present

## 2018-01-05 LAB — CBC
HEMATOCRIT: 31.1 % — AB (ref 36.0–46.0)
Hemoglobin: 11 g/dL — ABNORMAL LOW (ref 12.0–15.0)
MCH: 35.7 pg — ABNORMAL HIGH (ref 26.0–34.0)
MCHC: 35.4 g/dL (ref 30.0–36.0)
MCV: 101 fL — AB (ref 78.0–100.0)
Platelets: 196 10*3/uL (ref 150–400)
RBC: 3.08 MIL/uL — ABNORMAL LOW (ref 3.87–5.11)
RDW: 13.2 % (ref 11.5–15.5)
WBC: 15 10*3/uL — ABNORMAL HIGH (ref 4.0–10.5)

## 2018-01-05 LAB — BASIC METABOLIC PANEL
Anion gap: 7 (ref 5–15)
BUN: 16 mg/dL (ref 6–20)
CALCIUM: 8.4 mg/dL — AB (ref 8.9–10.3)
CO2: 22 mmol/L (ref 22–32)
CREATININE: 0.58 mg/dL (ref 0.44–1.00)
Chloride: 105 mmol/L (ref 101–111)
GFR calc Af Amer: >60 mL/min (ref >60)
GFR calc non Af Amer: >60 mL/min (ref >60)
GLUCOSE: 100 mg/dL — AB (ref 65–99)
Potassium: 3.7 mmol/L (ref 3.5–5.1)
Sodium: 134 mmol/L — ABNORMAL LOW (ref 135–145)

## 2018-01-05 MED ORDER — IBUPROFEN 600 MG PO TABS: 600.0000 mg | ORAL_TABLET | Freq: Four times a day (QID) | ORAL | 0 refills | Status: AC | PRN

## 2018-01-05 MED ORDER — IBUPROFEN 600 MG PO TABS: 600.0000 mg | ORAL_TABLET | Freq: Four times a day (QID) | ORAL | Status: DC | PRN

## 2018-01-05 MED ORDER — DOCUSATE SODIUM 100 MG PO CAPS: 100.0000 mg | ORAL_CAPSULE | Freq: Two times a day (BID) | ORAL | 0 refills | Status: AC

## 2018-01-05 MED ORDER — HYDROCODONE-ACETAMINOPHEN 5-325 MG PO TABS: 1.0000 | ORAL_TABLET | Freq: Four times a day (QID) | ORAL | 0 refills | Status: AC | PRN

## 2018-01-05 NOTE — Discharge Summary (Signed)
Physician Discharge Summary  Patient ID: Madeline Ramirez MRN: [id number] DOB/AGE: [birth year] 36 y.o.  Admit date: 01/04/2018 Discharge date: 01/05/2018  Admission Diagnoses: Endometriosis, pelvic pain  Discharge Diagnoses:  Active Problems:   Endometriosis   Discharged Condition: stable  Hospital Course: 36yo who presented for scheduled surgery- underwent TLH, RSO. See operative note for further details.  Postop course was uncomplicated and she was discharged home in stable condition, on POD#1 Consults: None  Significant Diagnostic Studies: labs:  Results for orders placed or performed during the hospital encounter of 01/04/18 (from the past 24 hour(s))  CBC     Status: Abnormal   Collection Time: 01/05/18  5:28 AM  Result Value Ref Range   WBC 15.0 (H) 4.0 - 10.5 K/uL   RBC 3.08 (L) 3.87 - 5.11 MIL/uL   Hemoglobin 11.0 (L) 12.0 - 15.0 g/dL   HCT 95.2 (L) 84.1 - 32.4 %   MCV 101.0 (H) 78.0 - 100.0 fL   MCH 35.7 (H) 26.0 - 34.0 pg   MCHC 35.4 30.0 - 36.0 g/dL   RDW 40.1 02.7 - 25.3 %   Platelets 196 150 - 400 K/uL  Basic metabolic panel     Status: Abnormal   Collection Time: 01/05/18  5:28 AM  Result Value Ref Range   Sodium 134 (L) 135 - 145 mmol/L   Potassium 3.7 3.5 - 5.1 mmol/L   Chloride 105 101 - 111 mmol/L   CO2 22 22 - 32 mmol/L   Glucose, Bld 100 (H) 65 - 99 mg/dL   BUN 16 6 - 20 mg/dL   Creatinine, Ser 6.64 0.44 - 1.00 mg/dL   Calcium 8.4 (L) 8.9 - 10.3 mg/dL   GFR calc non Af Amer >60 >60 mL/min   GFR calc Af Amer >60 >60 mL/min   Anion gap 7 5 - 15     Treatments: IV hydration, antibiotics: gentamycin and Clindamycin, analgesia: toradol, percocet and surgery: total laparoscopic hysterectomy and right salpingo-oophorectomy  Discharge Exam: Blood pressure (!) 90/44, pulse 69, temperature 97.8 F (36.6 C), temperature source Oral, resp. rate 18, height 5\' 5"  (1.651 m), weight 59.9 kg (132 lb), last menstrual period 10/04/2017, SpO2 99 %.    Gen: A&Ox3,  NAD CV: RRR, no MRG Resp: CTAB Abdomen: soft, NT, ND +BS Trocar incisions: C/D/I Ext: No edema, no calf tenderness bilaterally, SCDs in place    Disposition:    Allergies as of 01/05/2018      Reactions   Penicillins Hives, Swelling   Has patient had a PCN reaction causing immediate rash, facial/tongue/throat swelling, SOB or lightheadedness with hypotension: Yes Has patient had a PCN reaction causing severe rash involving mucus membranes or skin necrosis: Unknown Has patient had a PCN reaction that required hospitalization: No Has patient had a PCN reaction occurring within the last 10 years: No Childhood reaction. If all of the above answers are "NO", then may proceed with Cephalosporin use.   Banana Swelling, Other (See Comments)   Facial swelling & tingling.      Medication List    STOP taking these medications   levonorgestrel 20 MCG/24HR IUD Commonly known as:  MIRENA     TAKE these medications   ADULT GUMMY PO Take 2 tablets by mouth daily.   docusate sodium 100 MG capsule Commonly known as:  COLACE Take 1 capsule (100 mg total) by mouth 2 (two) times daily.   HYDROcodone-acetaminophen 5-325 MG tablet Commonly known as:  NORCO/VICODIN Take 1 tablet by mouth  every 6 (six) hours as needed for moderate pain.   ibuprofen 600 MG tablet Commonly known as:  ADVIL,MOTRIN Take 1 tablet (600 mg total) by mouth every 6 (six) hours as needed for mild pain or moderate pain. What changed:    medication strength  how much to take  when to take this  reasons to take this   Vitamin C Chew Chew 2 tablets by mouth daily.      Follow-up Information    Myna HidalgoOzan, Conn Trombetta, DO Follow up in 2 week(s).   Specialty:  Obstetrics and Gynecology Contact information: [address] [place] [address] [phone]           Signed: Sharon SellerJennifer M Marylon Ramirez 01/05/2018, 7:00 AM

## 2018-01-05 NOTE — Progress Notes (Signed)
Postop Note Day # 1  S:  Patient resting comfortable in bed.  Pain controlled.  Tolerating general diet. + flatus, no BM.  Minimal bleeding.  Ambulating without difficulty.  She denies n/v/f/c, SOB, or CP.  Foley removed last night and pt voiding freely.  Overall doing well and reports no acute complaints.  O: Temp:  [97.4 F (36.3 C)-98 F (36.7 C)] 97.8 F (36.6 C) (02/28 16100608) Pulse Rate:  [59-95] 69 (02/28 0608) Resp:  [11-18] 18 (02/28 0608) BP: (90-121)/(44-77) 90/44 (02/28 0608) SpO2:  [95 %-100 %] 99 % (02/28 0608) Weight:  [59.9 kg (132 lb)] 59.9 kg (132 lb) (02/27 1007)   Gen: A&Ox3, NAD CV: RRR, no MRG Resp: CTAB Abdomen: soft, NT, ND +BS Trocar incisions: C/D/I Ext: No edema, no calf tenderness bilaterally, SCDs in place  Labs:  Results for orders placed or performed during the hospital encounter of 01/04/18 (from the past 24 hour(s))  CBC     Status: Abnormal   Collection Time: 01/05/18  5:28 AM  Result Value Ref Range   WBC 15.0 (H) 4.0 - 10.5 K/uL   RBC 3.08 (L) 3.87 - 5.11 MIL/uL   Hemoglobin 11.0 (L) 12.0 - 15.0 g/dL   HCT 96.031.1 (L) 45.436.0 - 09.846.0 %   MCV 101.0 (H) 78.0 - 100.0 fL   MCH 35.7 (H) 26.0 - 34.0 pg   MCHC 35.4 30.0 - 36.0 g/dL   RDW 11.913.2 14.711.5 - 82.915.5 %   Platelets 196 150 - 400 K/uL  Basic metabolic panel     Status: Abnormal   Collection Time: 01/05/18  5:28 AM  Result Value Ref Range   Sodium 134 (L) 135 - 145 mmol/L   Potassium 3.7 3.5 - 5.1 mmol/L   Chloride 105 101 - 111 mmol/L   CO2 22 22 - 32 mmol/L   Glucose, Bld 100 (H) 65 - 99 mg/dL   BUN 16 6 - 20 mg/dL   Creatinine, Ser 5.620.58 0.44 - 1.00 mg/dL   Calcium 8.4 (L) 8.9 - 10.3 mg/dL   GFR calc non Af Amer >60 >60 mL/min   GFR calc Af Amer >60 >60 mL/min   Anion gap 7 5 - 15     A/P: Pt is a 37 y.o. s/p TLH, right salpingo-oophorectomy, POD#1  - Pain well controlled -GU: UOP is adequate -GI: Tolerating general diet -Activity: encouraged sitting up to chair and ambulation as  tolerated -Prophylaxis: early ambulation and SCDs while in bed -Labs: stable as above  Meeting postop milestones appropriately, plan for discharge home today  Myna HidalgoJennifer Kiahna Banghart, DO [phone] (pager) [phone] (office)

## 2018-01-05 NOTE — Discharge Instructions (Signed)
Laparoscopically Hysterectomy, Care After Refer to this sheet in the next few weeks. These instructions provide you with information on caring for yourself after your procedure. Your health care provider may also give you more specific instructions. Your treatment has been planned according to current medical practices, but problems sometimes occur. Call your health care provider if you have any problems or questions after your procedure. What can I expect after the procedure? After your procedure, it is typical to have the following:  Abdominal pain. You will be given pain medicine to control it.  Sore throat from the breathing tube that was inserted during surgery.  Follow these instructions at home:  Only take over-the-counter or prescription medicines for pain, discomfort, or fever as directed by your health care provider.  For pain management you may alternate between Percocet and Ibuprofen.  Try to take the Percocet sparingly and if it too strong, you may take tylenol instead.  Percocet may cause constipation- so be sure to take the colace (stool softener) twice daily  Do not take aspirin. It can cause bleeding.  Do not drive when taking pain medicine.  Follow your health care provider's advice regarding diet, exercise, lifting, driving, and general activities.  Resume your usual diet as directed and allowed.  Get plenty of rest and sleep.  Do not douche, use tampons, or have sexual intercourse for at least 6 weeks, or until your health care provider gives you permission.  Change your bandages (dressings) as directed by your health care provider.  Monitor your temperature and notify your health care provider of a fever.  Take showers instead of baths for 2-3 weeks.  Do not drink alcohol until your health care provider gives you permission.  If you develop constipation, you may take a mild laxative with your health care provider's permission. Bran foods may help with  constipation problems. Drinking enough fluids to keep your urine clear or pale yellow may help as well.  Try to have someone home with you for 1-2 weeks to help around the house.  Keep all of your follow-up appointments as directed by your health care provider. Contact a health care provider if:  You have swelling, redness, or increasing pain around your incision sites.  You have pus coming from your incision.  You notice a bad smell coming from your incision.  Your incision breaks open.  You feel dizzy or lightheaded.  You have pain or bleeding when you urinate.  You have persistent diarrhea.  You have persistent nausea and vomiting.  You have abnormal vaginal discharge.  You have a rash.  You have any type of abnormal reaction or develop an allergy to your medicine.  You have poor pain control with your prescribed medicine. Get help right away if:  You have a fever.  You have severe abdominal pain.  You have chest pain.  You have shortness of breath.  You faint.  You have pain, swelling, or redness in your leg.  You have heavy vaginal bleeding with blood clots. This information is not intended to replace advice given to you by your health care provider. Make sure you discuss any questions you have with your health care provider. Document Released: 10/14/2011 Document Revised: 04/01/2016 Document Reviewed: 05/10/2013 [employer] Interactive Patient Education  2017 [employer]

## 2018-01-05 NOTE — Progress Notes (Signed)
D/c home teaching complete
